# Patient Record
Sex: Male | Born: 1990
Health system: Southern US, Community
[De-identification: ages and names within clinical notes are randomized; demographics above are authoritative.]

## PROBLEM LIST (undated history)

## (undated) DIAGNOSIS — W3400XA Accidental discharge from unspecified firearms or gun, initial encounter: Secondary | ICD-10-CM

## (undated) HISTORY — PX: OTHER SURGICAL HISTORY: SHX169

---

## 2017-05-17 ENCOUNTER — Emergency Department (HOSPITAL_BASED_OUTPATIENT_CLINIC_OR_DEPARTMENT_OTHER)
Admission: EM | Admit: 2017-05-17 | Discharge: 2017-05-17 | Disposition: A | Discharge status: Home / Self Care | Payer: Medicaid Other | Attending: Emergency Medicine | Admitting: Emergency Medicine

## 2017-05-17 ENCOUNTER — Emergency Department (HOSPITAL_BASED_OUTPATIENT_CLINIC_OR_DEPARTMENT_OTHER): Payer: Medicaid Other

## 2017-05-17 ENCOUNTER — Encounter (HOSPITAL_BASED_OUTPATIENT_CLINIC_OR_DEPARTMENT_OTHER): Payer: Self-pay | Admitting: Emergency Medicine

## 2017-05-17 ENCOUNTER — Other Ambulatory Visit: Payer: Self-pay

## 2017-05-17 DIAGNOSIS — Y999 Unspecified external cause status: Secondary | ICD-10-CM | POA: Diagnosis not present

## 2017-05-17 DIAGNOSIS — M546 Pain in thoracic spine: Secondary | ICD-10-CM | POA: Insufficient documentation

## 2017-05-17 DIAGNOSIS — Y939 Activity, unspecified: Secondary | ICD-10-CM | POA: Insufficient documentation

## 2017-05-17 DIAGNOSIS — Y92410 Unspecified street and highway as the place of occurrence of the external cause: Secondary | ICD-10-CM | POA: Diagnosis not present

## 2017-05-17 DIAGNOSIS — S2232XA Fracture of one rib, left side, initial encounter for closed fracture: Secondary | ICD-10-CM | POA: Diagnosis not present

## 2017-05-17 DIAGNOSIS — S299XXA Unspecified injury of thorax, initial encounter: Secondary | ICD-10-CM | POA: Diagnosis present

## 2017-05-17 HISTORY — DX: Accidental discharge from unspecified firearms or gun, initial encounter: W34.00XA

## 2017-05-17 MED ORDER — HYDROCODONE-ACETAMINOPHEN 5-325 MG PO TABS: 1.0000 | ORAL_TABLET | ORAL | 0 refills | Status: AC | PRN

## 2017-05-17 NOTE — ED Notes (Signed)
Pt given d/c instructions as per chart. Rx x 1 with precautions. Verbalizes understanding. No questions. 
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
## 2017-05-17 NOTE — Discharge Instructions (Signed)
Your workup and imaging today showed evidence of a rib fracture.  We did not find evidence of collapsed lung and your exam was otherwise reassuring.  Please use the pain medicine to help with your symptoms and follow-up with your primary care physician.  If any symptoms change or worsen, please return to the nearest emergency department.

## 2020-05-12 ENCOUNTER — Other Ambulatory Visit: Payer: Self-pay

## 2020-05-12 ENCOUNTER — Emergency Department (HOSPITAL_BASED_OUTPATIENT_CLINIC_OR_DEPARTMENT_OTHER): Payer: Medicaid Other

## 2020-05-12 ENCOUNTER — Emergency Department (HOSPITAL_BASED_OUTPATIENT_CLINIC_OR_DEPARTMENT_OTHER)
Admission: EM | Admit: 2020-05-12 | Discharge: 2020-05-12 | Disposition: A | Discharge status: Home / Self Care | Payer: Medicaid Other | Attending: Emergency Medicine | Admitting: Emergency Medicine

## 2020-05-12 ENCOUNTER — Encounter (HOSPITAL_BASED_OUTPATIENT_CLINIC_OR_DEPARTMENT_OTHER): Payer: Self-pay | Admitting: *Deleted

## 2020-05-12 DIAGNOSIS — R059 Cough, unspecified: Secondary | ICD-10-CM

## 2020-05-12 DIAGNOSIS — F17200 Nicotine dependence, unspecified, uncomplicated: Secondary | ICD-10-CM | POA: Diagnosis not present

## 2020-05-12 DIAGNOSIS — U071 COVID-19: Secondary | ICD-10-CM | POA: Insufficient documentation

## 2020-05-12 DIAGNOSIS — Z20822 Contact with and (suspected) exposure to covid-19: Secondary | ICD-10-CM

## 2020-05-12 LAB — CBC WITH DIFFERENTIAL/PLATELET
Abs Immature Granulocytes: 0.04 10*3/uL (ref 0.00–0.07)
Basophils Absolute: 0 10*3/uL (ref 0.0–0.1)
Basophils Relative: 0 %
Eosinophils Absolute: 0.1 10*3/uL (ref 0.0–0.5)
Eosinophils Relative: 1 %
HCT: 44.4 % (ref 39.0–52.0)
Hemoglobin: 15.3 g/dL (ref 13.0–17.0)
Immature Granulocytes: 0 %
Lymphocytes Relative: 4 %
Lymphs Abs: 0.4 10*3/uL — ABNORMAL LOW (ref 0.7–4.0)
MCH: 30.4 pg (ref 26.0–34.0)
MCHC: 34.5 g/dL (ref 30.0–36.0)
MCV: 88.1 fL (ref 80.0–100.0)
Monocytes Absolute: 1.1 10*3/uL — ABNORMAL HIGH (ref 0.1–1.0)
Monocytes Relative: 11 %
Neutro Abs: 8.3 10*3/uL — ABNORMAL HIGH (ref 1.7–7.7)
Neutrophils Relative %: 84 %
Platelets: 249 10*3/uL (ref 150–400)
RBC: 5.04 MIL/uL (ref 4.22–5.81)
RDW: 12.7 % (ref 11.5–15.5)
WBC: 9.8 10*3/uL (ref 4.0–10.5)
nRBC: 0 % (ref 0.0–0.2)

## 2020-05-12 LAB — BASIC METABOLIC PANEL
Anion gap: 11 (ref 5–15)
BUN: 10 mg/dL (ref 6–20)
CO2: 21 mmol/L — ABNORMAL LOW (ref 22–32)
Calcium: 9.4 mg/dL (ref 8.9–10.3)
Chloride: 102 mmol/L (ref 98–111)
Creatinine, Ser: 1.29 mg/dL — ABNORMAL HIGH (ref 0.61–1.24)
GFR, Estimated: >60 mL/min (ref >60)
Glucose, Bld: 128 mg/dL — ABNORMAL HIGH (ref 70–99)
Potassium: 3.4 mmol/L — ABNORMAL LOW (ref 3.5–5.1)
Sodium: 134 mmol/L — ABNORMAL LOW (ref 135–145)

## 2020-05-12 LAB — TROPONIN I (HIGH SENSITIVITY)
Troponin I (High Sensitivity): 5 ng/L (ref <18)
Troponin I (High Sensitivity): 4 ng/L (ref <18)

## 2020-05-12 LAB — SARS CORONAVIRUS 2 (TAT 6-24 HRS): SARS Coronavirus 2: POSITIVE — AB

## 2020-05-12 MED ORDER — BENZONATATE 100 MG PO CAPS: 100.0000 mg | ORAL_CAPSULE | Freq: Three times a day (TID) | ORAL | 0 refills | Status: AC

## 2020-05-12 MED ORDER — IBUPROFEN 800 MG PO TABS
800.0000 mg | ORAL_TABLET | Freq: Once | ORAL | Status: AC
  Administered 2020-05-12: 800 mg via ORAL
  Filled 2020-05-12: qty 1

## 2020-05-12 MED ORDER — ACETAMINOPHEN ER 650 MG PO TBCR: 650.0000 mg | EXTENDED_RELEASE_TABLET | Freq: Three times a day (TID) | ORAL | 0 refills | Status: AC | PRN

## 2020-05-12 MED ORDER — ONDANSETRON 4 MG PO TBDP: 4.0000 mg | ORAL_TABLET | Freq: Three times a day (TID) | ORAL | 0 refills | Status: AC | PRN

## 2020-05-12 NOTE — ED Notes (Signed)
Pt took 1 tylenol approximately 1 1/2 hrs ago
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
## 2020-05-12 NOTE — ED Notes (Signed)
ED Provider at bedside. 

## 2020-05-12 NOTE — Discharge Instructions (Addendum)
As discussed, all of your labs were reassuring today.  Your chest x-ray was negative for signs of pneumonia.  Your Covid test is pending.  Continue to self quarantine until your results become available.  I am sending you home with symptomatic treatment.  Take as needed.  Follow-up with PCP if symptoms not improved within the next week.  Return to the ER for new or worsening symptoms.

## 2020-05-12 NOTE — ED Triage Notes (Signed)
Cough, body aches, sore throat, headache, fever. He took Tylenol 45 minutes ago. Both of his sons are Covid positive.

## 2020-05-13 ENCOUNTER — Telehealth (HOSPITAL_COMMUNITY): Payer: Self-pay | Admitting: Pharmacist

## 2020-05-13 ENCOUNTER — Other Ambulatory Visit: Payer: Self-pay | Admitting: Physician Assistant

## 2020-05-13 ENCOUNTER — Telehealth: Payer: Self-pay | Admitting: Adult Health

## 2020-05-13 MED ORDER — NIRMATRELVIR/RITONAVIR (PAXLOVID)TABLET: 3.0000 | ORAL_TABLET | Freq: Two times a day (BID) | ORAL | 0 refills | Status: AC

## 2020-05-13 MED FILL — PAXLOVID 20 X 150 MG & 10 X: 20 X 150 MG | 5 days supply | Qty: 30 | Fill #0

## 2020-05-13 NOTE — Telephone Encounter (Signed)
Called to discuss with patient about COVID-19 symptoms and the use of one of the available treatments for those with mild to moderate Covid symptoms and at a high risk of hospitalization.  Pt appears to qualify for outpatient treatment due to co-morbid conditions and/or a member of an at-risk group in accordance with the FDA Emergency Use Authorization.     Unable to reach pt - left message to call back   Dujuan Stankowski NP -C
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
## 2020-05-13 NOTE — Telephone Encounter (Signed)
Patient was prescribed oral covid treatment Paxlovid and treatment note was reviewed. Medication has been received by [facility] and reviewed for appropriateness.  Drug Interactions or Dosage Adjustments Noted:  Crcl: 106 mL/min - no renal adjustments needed  DDI - recommendations:  Hydrocodone/APAP - hold (patient not taking, script from 2019)  Delivery Method: Pick-up   Patient contacted for counseling on 05/13/20 and verbalized understanding.   Delivery or Pick-Up Date: 05/13/20   Marquette Old 05/13/2020, 1:15 PM [facility] Outpatient Pharmacist Phone# [phone]

## 2021-09-28 ENCOUNTER — Other Ambulatory Visit (HOSPITAL_BASED_OUTPATIENT_CLINIC_OR_DEPARTMENT_OTHER): Payer: Self-pay

## 2021-09-28 ENCOUNTER — Other Ambulatory Visit: Payer: Self-pay

## 2021-09-28 ENCOUNTER — Emergency Department (HOSPITAL_BASED_OUTPATIENT_CLINIC_OR_DEPARTMENT_OTHER)
Admission: EM | Admit: 2021-09-28 | Discharge: 2021-09-28 | Disposition: A | Discharge status: Home / Self Care | Payer: Medicaid Other | Attending: Emergency Medicine | Admitting: Emergency Medicine

## 2021-09-28 ENCOUNTER — Emergency Department (HOSPITAL_BASED_OUTPATIENT_CLINIC_OR_DEPARTMENT_OTHER): Payer: Medicaid Other

## 2021-09-28 ENCOUNTER — Encounter (HOSPITAL_BASED_OUTPATIENT_CLINIC_OR_DEPARTMENT_OTHER): Payer: Self-pay

## 2021-09-28 DIAGNOSIS — J029 Acute pharyngitis, unspecified: Secondary | ICD-10-CM | POA: Diagnosis present

## 2021-09-28 DIAGNOSIS — J020 Streptococcal pharyngitis: Secondary | ICD-10-CM | POA: Diagnosis not present

## 2021-09-28 LAB — CBC WITH DIFFERENTIAL/PLATELET
Abs Immature Granulocytes: 0.11 10*3/uL — ABNORMAL HIGH (ref 0.00–0.07)
Basophils Absolute: 0.1 10*3/uL (ref 0.0–0.1)
Basophils Relative: 0 %
Eosinophils Absolute: 0 10*3/uL (ref 0.0–0.5)
Eosinophils Relative: 0 %
HCT: 40.5 % (ref 39.0–52.0)
Hemoglobin: 14.2 g/dL (ref 13.0–17.0)
Immature Granulocytes: 1 %
Lymphocytes Relative: 9 %
Lymphs Abs: 1.7 10*3/uL (ref 0.7–4.0)
MCH: 31.1 pg (ref 26.0–34.0)
MCHC: 35.1 g/dL (ref 30.0–36.0)
MCV: 88.6 fL (ref 80.0–100.0)
Monocytes Absolute: 1.7 10*3/uL — ABNORMAL HIGH (ref 0.1–1.0)
Monocytes Relative: 9 %
Neutro Abs: 14.8 10*3/uL — ABNORMAL HIGH (ref 1.7–7.7)
Neutrophils Relative %: 81 %
Platelets: 297 10*3/uL (ref 150–400)
RBC: 4.57 MIL/uL (ref 4.22–5.81)
RDW: 12 % (ref 11.5–15.5)
WBC: 18.4 10*3/uL — ABNORMAL HIGH (ref 4.0–10.5)
nRBC: 0 % (ref 0.0–0.2)

## 2021-09-28 LAB — BASIC METABOLIC PANEL
Anion gap: 6 (ref 5–15)
BUN: 6 mg/dL (ref 6–20)
CO2: 22 mmol/L (ref 22–32)
Calcium: 7.4 mg/dL — ABNORMAL LOW (ref 8.9–10.3)
Chloride: 111 mmol/L (ref 98–111)
Creatinine, Ser: 0.74 mg/dL (ref 0.61–1.24)
GFR, Estimated: >60 mL/min (ref >60)
Glucose, Bld: 110 mg/dL — ABNORMAL HIGH (ref 70–99)
Potassium: 2.9 mmol/L — ABNORMAL LOW (ref 3.5–5.1)
Sodium: 139 mmol/L (ref 135–145)

## 2021-09-28 LAB — GROUP A STREP BY PCR: Group A Strep by PCR: DETECTED — AB

## 2021-09-28 MED ORDER — KETOROLAC TROMETHAMINE 15 MG/ML IJ SOLN
15.0000 mg | Freq: Once | INTRAMUSCULAR | Status: AC
  Administered 2021-09-28: 15 mg via INTRAVENOUS
  Filled 2021-09-28: qty 1

## 2021-09-28 MED ORDER — SODIUM CHLORIDE 0.9 % IV BOLUS
1000.0000 mL | Freq: Once | INTRAVENOUS | Status: AC
  Administered 2021-09-28: 1000 mL via INTRAVENOUS

## 2021-09-28 MED ORDER — LIDOCAINE VISCOUS HCL 2 % MT SOLN
15.0000 mL | Freq: Once | OROMUCOSAL | Status: AC
  Administered 2021-09-28: 15 mL via OROMUCOSAL
  Filled 2021-09-28: qty 15

## 2021-09-28 MED ORDER — AMOXICILLIN 500 MG PO CAPS
500.0000 mg | ORAL_CAPSULE | Freq: Once | ORAL | Status: AC
  Administered 2021-09-28: 500 mg via ORAL
  Filled 2021-09-28: qty 1

## 2021-09-28 MED ORDER — DEXAMETHASONE SODIUM PHOSPHATE 10 MG/ML IJ SOLN
10.0000 mg | Freq: Once | INTRAMUSCULAR | Status: AC
  Administered 2021-09-28: 10 mg via INTRAVENOUS
  Filled 2021-09-28: qty 1

## 2021-09-28 MED ORDER — IOHEXOL 300 MG/ML  SOLN
75.0000 mL | Freq: Once | INTRAMUSCULAR | Status: AC | PRN
  Administered 2021-09-28: 75 mL via INTRAVENOUS

## 2021-09-28 MED ORDER — POTASSIUM CHLORIDE 20 MEQ PO PACK
40.0000 meq | PACK | Freq: Once | ORAL | Status: AC
  Administered 2021-09-28: 40 meq via ORAL
  Filled 2021-09-28: qty 2

## 2021-09-28 MED ORDER — KETOROLAC TROMETHAMINE 30 MG/ML IJ SOLN: 30.0000 mg | Freq: Once | INTRAMUSCULAR | Status: DC

## 2021-09-28 MED ORDER — AMOXICILLIN 500 MG PO CAPS
500.0000 mg | ORAL_CAPSULE | Freq: Two times a day (BID) | ORAL | 0 refills | Status: AC
  Filled 2021-09-28: qty 20, 10d supply, fill #0

## 2021-09-28 NOTE — ED Provider Notes (Signed)
[facility] EMERGENCY DEPARTMENT Provider Note   CSN: [id number] Arrival date & time: 09/28/21  4097     History {Add pertinent medical, surgical, social history, OB history to HPI:1} Chief Complaint  Patient presents with   Sore Throat    Dustin Carroll is a 31 y.o. male.  Patient is a 31 year old male presenting for complaints of throat pain x1 week.  Patient admits to throat pain, pain with swallowing, changes in voice over the past several days.  Admits to throat pain x1 week with subjective fevers.  Denies any sick contacts.  Denies any drooling.  Able to tolerate liquid orally.  The history is provided by the patient. No language interpreter was used.  Sore Throat Pertinent negatives include no headaches.       Home Medications Prior to Admission medications   Medication Sig Start Date End Date Taking? Authorizing Provider  acetaminophen (TYLENOL 8 HOUR) 650 MG CR tablet Take 1 tablet (650 mg total) by mouth every 8 (eight) hours as needed for pain. 05/12/20   Mannie Stabile, PA-C  benzonatate (TESSALON) 100 MG capsule Take 1 capsule (100 mg total) by mouth every 8 (eight) hours. 05/12/20   Mannie Stabile, PA-C  HYDROcodone-acetaminophen (NORCO/VICODIN) 5-325 MG tablet Take 1 tablet by mouth every 4 (four) hours as needed. 05/17/17   Tegeler, Canary Brim, MD  ondansetron (ZOFRAN ODT) 4 MG disintegrating tablet Take 1 tablet (4 mg total) by mouth every 8 (eight) hours as needed for nausea or vomiting. 05/12/20   Mannie Stabile, PA-C      Allergies    Patient has no known allergies.    Review of Systems   Review of Systems  Constitutional:  Positive for fever. Negative for chills.  HENT:  Positive for sore throat and voice change. Negative for congestion, facial swelling and rhinorrhea.   Neurological:  Negative for light-headedness and headaches.    Physical Exam Updated Vital Signs BP 133/76   Pulse 75   Temp 99.5 F (37.5 C) (Oral)    Resp 16   Ht 6\' 1"  (1.854 m)   Wt 111.1 kg   SpO2 100%   BMI 32.32 kg/m  Physical Exam Vitals and nursing note reviewed.  Constitutional:      Appearance: He is well-developed. He is ill-appearing. He is not toxic-appearing.  HENT:     Head: Normocephalic and atraumatic.     Nose: Nose normal.     Mouth/Throat:     Pharynx: Pharyngeal swelling and posterior oropharyngeal erythema present.     Comments: Mallampati score 4.  Unable to visualize tonsils or uvula with tongue depression.  Has muffled voice on exam. Cardiovascular:     Rate and Rhythm: Normal rate and regular rhythm.  Pulmonary:     Effort: Pulmonary effort is normal.     Breath sounds: Normal breath sounds.  Skin:    Capillary Refill: Capillary refill takes less than 2 seconds.  Neurological:     General: No focal deficit present.     Mental Status: He is alert and oriented to person, place, and time.     ED Results / Procedures / Treatments   Labs (all labs ordered are listed, but only abnormal results are displayed) Labs Reviewed  GROUP A STREP BY PCR - Abnormal; Notable for the following components:      Result Value   Group A Strep by PCR DETECTED (*)    All other components within normal limits  CBC WITH  DIFFERENTIAL/PLATELET - Abnormal; Notable for the following components:   WBC 18.4 (*)    Neutro Abs 14.8 (*)    Monocytes Absolute 1.7 (*)    Abs Immature Granulocytes 0.11 (*)    All other components within normal limits  BASIC METABOLIC PANEL - Abnormal; Notable for the following components:   Potassium 2.9 (*)    Glucose, Bld 110 (*)    Calcium 7.4 (*)    All other components within normal limits    EKG None  Radiology No results found.  Procedures Procedures  {Document cardiac monitor, telemetry assessment procedure when appropriate:1}  Medications Ordered in ED Medications  amoxicillin (AMOXIL) capsule 500 mg (has no administration in time range)  dexamethasone (DECADRON) injection  10 mg (10 mg Intravenous Given 09/28/21 0918)  lidocaine (XYLOCAINE) 2 % viscous mouth solution 15 mL (15 mLs Mouth/Throat Given 09/28/21 0920)  sodium chloride 0.9 % bolus 1,000 mL ( Intravenous Stopped 09/28/21 1022)  ketorolac (TORADOL) 15 MG/ML injection 15 mg (15 mg Intravenous Given 09/28/21 0918)  iohexol (OMNIPAQUE) 300 MG/ML solution 75 mL (75 mLs Intravenous Contrast Given 09/28/21 1033)    ED Course/ Medical Decision Making/ A&P                           Medical Decision Making Amount and/or Complexity of Data Reviewed Labs: ordered. Radiology: ordered.  Risk Prescription drug management.   39:75 AM  31 year old male presenting for complaints of throat pain x1 week.  Patient is alert and oriented x3, no acute distress, afebrile, stable vital signs.  Physical exam demonstrates Mallampati score 4.  Unable to visualize tonsils, tonsillar layers, or uvula.  Patient's oropharynx is erythematous and edematous.  Muffled voice on exam.  No drooling or difficulty tolerating secretions.  Decadron, Toradol, and viscous lidocaine given.  Strep positive.  Amoxicillin 500 mg given.  CT neck ordered to rule out deep neck space infection due to inability to visualize the airway and changes in voice.  CT soft tissue neck with IV contrast demonstrates*  {Document critical care time when appropriate:1} {Document review of labs and clinical decision tools ie heart score, Chads2Vasc2 etc:1}  {Document your independent review of radiology images, and any outside records:1} {Document your discussion with family members, caretakers, and with consultants:1} {Document social determinants of health affecting pt's care:1} {Document your decision making why or why not admission, treatments were needed:1} Final Clinical Impression(s) / ED Diagnoses Final diagnoses:  None    Rx / DC Orders ED Discharge Orders     None

## 2021-09-28 NOTE — ED Triage Notes (Signed)
Pt reports severe sore throat for 1 week . Voice muffled. Feels weak and tired

## 2021-09-28 NOTE — Discharge Instructions (Signed)
Today you tested positive for strep throat.  Please take amoxicillin antibiotic 500 mg twice a day for the next 10 days.  Follow-up with ENT specialist if symptoms do not improve.  Please return to emergency department immediately for any repeat changes in voice or difficulty swallowing saliva.
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
## 2024-01-07 NOTE — ED Provider Notes (Signed)
 Patient placed in First Look pathway, seen and evaluated for chief complaint of right sided hip pain, onset 3 days ago. Denies any known injury. Took pain away with minimal relief.  Pertinent exam findings include non-toxic in appearance. Patient counseled on process, plan, and necessity for staying for completing the evaluation.   This document serves as a record of services personally performed by Charleen Fox PA-C. 9:44 AM  [facility] Emergency Department Emergency Department Provider Note  This document was created using the aid of voice recognition Dragon dictation software.   Provider at bedside: 01/07/2024 10:58 AM  History obtained from the: patient  History   Chief Complaint  Patient presents with   Hip Pain    HPI  Dustin Carroll is a 33 y.o. male who presents to the ED with complaints of right hip pain x 3 days, worsening. Denies injury or trauma but states he noted it after intercourse 3 days ago. Denies radiation of pain, numbness/tingling or weakness. No back pain.   10:58 AM Previous medical records reviewed from [facility] Everywhere and EPIC Chart Review.   No LMP for male patient.  Past Medical History Medical History[1]  Past Surgical History Surgical History[2]  Medications These were reviewed. See nursing note for details.  Allergies Patient has no known allergies.  Family History Family History[3]  Social History Social History[4]  All family history, social history and PMH were reviewed by myself. See nursing note for details.   Review of Systems  Review of Systems  Constitutional:  Negative for activity change, chills and fever.  Musculoskeletal:  Negative for back pain.       Right hip pain    Physical Exam   Vitals:   01/07/24 0940  BP: 115/75  BP Location: Right arm  Patient Position: Sitting  Pulse: 65  Resp: 16  Temp: 98.1 F (36.7 C)  TempSrc: Oral  SpO2: 98%  Weight: 102 kg (225 lb 12.8 oz)     BP 115/75 (BP Location: Right arm, Patient Position: Sitting)   Pulse 65   Temp 98.1 F (36.7 C) (Oral)   Resp 16   Wt 102 kg (225 lb 12.8 oz)   SpO2 98%   BMI 29.79 kg/m   Physical Exam Vitals and nursing note reviewed.  Constitutional:      General: He is not in acute distress.    Appearance: Normal appearance. He is normal weight. He is not ill-appearing, toxic-appearing or diaphoretic.  HENT:     Head: Normocephalic and atraumatic.     Mouth/Throat:     Mouth: Mucous membranes are moist.  Eyes:     Extraocular Movements: Extraocular movements intact.     Conjunctiva/sclera: Conjunctivae normal.  Cardiovascular:     Rate and Rhythm: Normal rate and regular rhythm.  Pulmonary:     Effort: Pulmonary effort is normal.     Breath sounds: Normal breath sounds.  Musculoskeletal:        General: Normal range of motion.     Cervical back: Normal range of motion.     Comments: No tenderness, pain worse with abduction  Skin:    General: Skin is warm and dry.  Neurological:     General: No focal deficit present.     Mental Status: He is alert.  Psychiatric:        Mood and Affect: Mood normal.        Behavior: Behavior normal.        Thought  Content: Thought content normal.        Judgment: Judgment normal.     Results  LABS  Labs Reviewed - No data to display  Lab Results (last 24 hours)     ** No results found for the last 24 hours. **      ED Course    On my initial exam, the pt was awake, alert, oriented and in no acute distress.    Discussed options for workup with patient including risks and benefits via shared decision making and decided to proceed with workup.  Differential considered includes: DDx:- strain, sprain, fracture, dislocation, gout, arthritis, septic arthritis.   Labs/Imaging/medications/other testing ordered due to concerns discussed in history of and physical exam include: XR right hip  ER provider interpretation of Labs:  I  directly interpreted x-ray which showed no acute fracture or dislocation  I dicussed imaging finding with provider: No  All radiology studies reviewed independently, additionally reading provided by radiologist available, unless otherwise noted.   Clinical Complexity Number and complexity of problems addressed: Patient's presentation is most consistent with acute complicated illness / injury requiring diagnostic workup.  Risk of complications and/or morbidity or mortality of patient management: Patient's impaired access to primary care increases the complexity of managing their  presentation with right hip pain.   Medications Given in Emergency Department  Medications - No data to display  Procedure Note  Procedures  Medical Decision Making  Medical Decision Making Amount and/or Complexity of Data Reviewed Radiology: ordered.   ED Clinical Impression   1. Right hip pain     Clinical picture was discussed with patient along with risks and benefits of management options, shared decision making we will discharge the patient home with close outpatient follow-up for continued management.  Patient's lack of access to care increases the complexity of managing their presentation and will complicate their treatment plans. I have addressed this by provided education about medical condition and treatment needed.   Based on the above findings, I believe patient is hemodynamically stable for discharge.   Decision to admit or increased level of care requiring transfer: No: Evaluation and diagnostic workup in ED does not suggest an emergent condition requiring admission or immediate observation beyond what has been performed at this time. The patient is safe for discharge and has been instructed to return for worsening symptoms, change in symptoms, or any other concerns.  Provider time spent in patient care today, inclusive of but not limited to clinical reassessment, review of diagnostic  studies, and discharge preparation, was greater than 30 minutes.   The following prescriptions were given for continued management of symptoms or pathologies: Diclofenac, Flexeril  Parental narcotics ordered: No  Drug Therapy requiring intensive monitoring ordered: No  OTC medications advised and discussed with patient include Tylenol .  Prescription medications discussed: muscle relaxer  Patient and/or family educated about specific return precautions for given chief complaint and symptoms.  Patient and/or family educated about follow-up with PCP.  Patient and/or family expressed understanding of return precautions and need for follow-up.    Patient discharged. Diagnosis, treatment, plan discussed with patient.  All questions were answered to the patient's satisfaction. General information regarding medications, when prescribed, were discussed. Patient instructed to follow-up here in the emergency department for new or worsening symptoms.       1. Right hip pain      Medication List     START taking these medications    cyclobenzaprine 5 mg tablet Commonly known as:  FLEXERIL Take 1 tablet (5 mg total) by mouth 3 (three) times a day for 5 days.   diclofenac 75 mg EC tablet Commonly known as: VOLTAREN Take 1 tablet (75 mg total) by mouth 2 (two) times a day for 7 days.       STOP taking these medications    meloxicam 15 mg tablet Commonly known as: MOBIC         Where to Get Your Medications     These medications were sent to [id number] - [place], [place] - [address] AT [facility] [address] - PHONE: [phone] - FAX: [phone]  [address], [place]  [address]    Phone: [phone]  cyclobenzaprine 5 mg tablet diclofenac 75 mg EC tablet    FOLLOW UP [facility] - PHYSICIAN & COMMUNITY [facility] ACCESS [facility] [place]  [phone] Call    [facility] -  EMERGENCY DEPARTMENT [address] [employer] [place]  [address] [phone]  As needed, If symptoms worsen  _____________________________        [1] Past Medical History: Diagnosis Date   Asthma (CMD)   [2] Past Surgical History: Procedure Laterality Date   ELBOW SURGERY     Procedure: ELBOW SURGERY  [3] Family History Problem Relation Name Age of Onset   Diabetes Father     Hypertension Father     Stroke Father     Asthma Brother ##Brother1    Arthritis Neg Hx     Cancer Neg Hx     Heart disease Neg Hx    [4] Social History Tobacco Use   Smoking status: Every Day   Smokeless tobacco: Never  Substance Use Topics   Alcohol use: No   Drug use: No

## 2024-03-26 ENCOUNTER — Emergency Department (HOSPITAL_BASED_OUTPATIENT_CLINIC_OR_DEPARTMENT_OTHER)
Admission: EM | Admit: 2024-03-26 | Discharge: 2024-03-26 | Disposition: A | Discharge status: Home / Self Care | Attending: Emergency Medicine | Admitting: Emergency Medicine

## 2024-03-26 ENCOUNTER — Other Ambulatory Visit: Payer: Self-pay

## 2024-03-26 ENCOUNTER — Emergency Department (HOSPITAL_BASED_OUTPATIENT_CLINIC_OR_DEPARTMENT_OTHER)

## 2024-03-26 DIAGNOSIS — M79602 Pain in left arm: Secondary | ICD-10-CM

## 2024-03-26 DIAGNOSIS — M25522 Pain in left elbow: Secondary | ICD-10-CM | POA: Diagnosis not present

## 2024-03-26 MED ORDER — IBUPROFEN 800 MG PO TABS: 800.0000 mg | ORAL_TABLET | Freq: Three times a day (TID) | ORAL | 0 refills | Status: AC | PRN

## 2024-03-26 MED ORDER — OXYCODONE-ACETAMINOPHEN 5-325 MG PO TABS
1.0000 | ORAL_TABLET | Freq: Once | ORAL | Status: AC
  Administered 2024-03-26: 1 via ORAL
  Filled 2024-03-26: qty 1

## 2024-03-26 NOTE — ED Notes (Signed)
 Patient transferred from waiting room to ED treatment room. Assuming pt care at this time.
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
## 2024-03-26 NOTE — Discharge Instructions (Addendum)
 Thank you for visiting the Emergency Department today. It was a pleasure to be part of your healthcare team.   Your were seen today for left arm pain  As discussed, utilize Tylenol  and ibuprofen  as needed for pain management.  It is important to watch for warning signs such as worsening pain, fever, or any numbness or tingling to the extremity. If any of these happen, return to the Emergency Department or call 911.  Thank you for trusting us  with your health.
